# Patient Record
Sex: Female | Born: 1980 | Hispanic: Yes | Marital: Married | Smoking: Never smoker
Health system: Southern US, Community
[De-identification: ages and names within clinical notes are randomized; demographics above are authoritative.]

## PROBLEM LIST (undated history)

## (undated) HISTORY — PX: TUBAL LIGATION: SHX77

---

## 2021-08-26 ENCOUNTER — Emergency Department (HOSPITAL_BASED_OUTPATIENT_CLINIC_OR_DEPARTMENT_OTHER)
Admission: EM | Admit: 2021-08-26 | Discharge: 2021-08-26 | Disposition: A | Discharge status: Home / Self Care | Payer: Worker's Compensation | Attending: Emergency Medicine | Admitting: Emergency Medicine

## 2021-08-26 ENCOUNTER — Other Ambulatory Visit: Payer: Self-pay

## 2021-08-26 ENCOUNTER — Encounter (HOSPITAL_BASED_OUTPATIENT_CLINIC_OR_DEPARTMENT_OTHER): Payer: Self-pay | Admitting: Emergency Medicine

## 2021-08-26 DIAGNOSIS — Z23 Encounter for immunization: Secondary | ICD-10-CM | POA: Insufficient documentation

## 2021-08-26 DIAGNOSIS — N9489 Other specified conditions associated with female genital organs and menstrual cycle: Secondary | ICD-10-CM | POA: Diagnosis not present

## 2021-08-26 DIAGNOSIS — Y9229 Other specified public building as the place of occurrence of the external cause: Secondary | ICD-10-CM | POA: Insufficient documentation

## 2021-08-26 DIAGNOSIS — Z7721 Contact with and (suspected) exposure to potentially hazardous body fluids: Secondary | ICD-10-CM | POA: Diagnosis present

## 2021-08-26 DIAGNOSIS — Y990 Civilian activity done for income or pay: Secondary | ICD-10-CM | POA: Diagnosis not present

## 2021-08-26 DIAGNOSIS — Y93E2 Activity, laundry: Secondary | ICD-10-CM | POA: Insufficient documentation

## 2021-08-26 DIAGNOSIS — W460XXA Contact with hypodermic needle, initial encounter: Secondary | ICD-10-CM | POA: Diagnosis not present

## 2021-08-26 DIAGNOSIS — S61232A Puncture wound without foreign body of right middle finger without damage to nail, initial encounter: Secondary | ICD-10-CM | POA: Diagnosis not present

## 2021-08-26 LAB — COMPREHENSIVE METABOLIC PANEL
ALT: 20 U/L (ref 0–44)
AST: 19 U/L (ref 15–41)
Albumin: 4 g/dL (ref 3.5–5.0)
Alkaline Phosphatase: 89 U/L (ref 38–126)
Anion gap: 8 (ref 5–15)
BUN: 9 mg/dL (ref 6–20)
CO2: 24 mmol/L (ref 22–32)
Calcium: 8.8 mg/dL — ABNORMAL LOW (ref 8.9–10.3)
Chloride: 102 mmol/L (ref 98–111)
Creatinine, Ser: 0.7 mg/dL (ref 0.44–1.00)
GFR, Estimated: >60 mL/min (ref >60)
Glucose, Bld: 99 mg/dL (ref 70–99)
Potassium: 3.4 mmol/L — ABNORMAL LOW (ref 3.5–5.1)
Sodium: 134 mmol/L — ABNORMAL LOW (ref 135–145)
Total Bilirubin: 0.4 mg/dL (ref 0.3–1.2)
Total Protein: 7.6 g/dL (ref 6.5–8.1)

## 2021-08-26 LAB — RAPID HIV SCREEN (HIV 1/2 AB+AG)
HIV 1/2 Antibodies: NONREACTIVE
HIV-1 P24 Antigen - HIV24: NONREACTIVE

## 2021-08-26 LAB — HEPATITIS C ANTIBODY: HCV Ab: NONREACTIVE

## 2021-08-26 LAB — HCG, SERUM, QUALITATIVE: Preg, Serum: NEGATIVE

## 2021-08-26 LAB — HEPATITIS B SURFACE ANTIGEN: Hepatitis B Surface Ag: NONREACTIVE

## 2021-08-26 MED ORDER — TETANUS-DIPHTH-ACELL PERTUSSIS 5-2.5-18.5 LF-MCG/0.5 IM SUSY
0.5000 mL | PREFILLED_SYRINGE | Freq: Once | INTRAMUSCULAR | Status: AC
  Administered 2021-08-26: 0.5 mL via INTRAMUSCULAR
  Filled 2021-08-26: qty 0.5

## 2021-08-26 MED ORDER — ELVITEG-COBIC-EMTRICIT-TENOFAF 150-150-200-10 MG PO TABS: 1.0000 | ORAL_TABLET | Freq: Every day | ORAL | 0 refills | Status: DC

## 2021-08-26 MED ORDER — ELVITEG-COBIC-EMTRICIT-TENOFAF 150-150-200-10 MG PREPACK
1.0000 | ORAL_TABLET | Freq: Once | ORAL | Status: AC
  Administered 2021-08-26: 1 via ORAL
  Filled 2021-08-26: qty 1

## 2021-08-26 NOTE — Discharge Instructions (Signed)
We have started you on a medication to prevent any HIV exposure.  Follow-up with a primary care provider within the next few weeks, you will need repeat testing at that time  Return for new or worsening symptoms.

## 2021-08-26 NOTE — ED Notes (Signed)
ED Provider at bedside. 

## 2021-08-26 NOTE — ED Triage Notes (Signed)
Pt arrives pov, ambulatory, primary language spanish. C/o needle stick to right hand middle finger. Pt endorses cleaning with alcohol and soap and water after incident.

## 2021-08-26 NOTE — ED Provider Notes (Signed)
[facility] EMERGENCY DEPARTMENT Provider Note   CSN: [id number] Arrival date & time: 08/26/21  1448    History  Chief Complaint  Patient presents with   Body Fluid Exposure    Melissa Mclaughlin is a 41 y.o. female here for evaluation puncture wound to 30s right upper extremity.  Immediately started bleeding.  Works at an [employer].  Went to reach into a pole however when it was stopped by needle and laundry.  Unknown whom he will belong to.  No paresthesias, weakness.  Unknown last tetanus, reviewed epic, and none documented.  No fever, weakness, pulsatile bleeding. Cleaned area with soap, water and alcohol swab PTA  Declines Spanish interpretor.  HPI     Home Medications Prior to Admission medications   Medication Sig Start Date End Date Taking? Authorizing Provider  elvitegravir-cobicistat-emtricitabine-tenofovir (GENVOYA) 150-150-200-10 MG TABS tablet Take 1 tablet by mouth daily with breakfast. 08/26/21  Yes Zela Sobieski A, PA-C      Allergies    Patient has no allergy information on record.    Review of Systems   Review of Systems  Constitutional: Negative.   HENT: Negative.    Respiratory: Negative.    Cardiovascular: Negative.   Genitourinary: Negative.   Musculoskeletal: Negative.   Skin:  Positive for wound.  Neurological: Negative.   All other systems reviewed and are negative.  Physical Exam Updated Vital Signs BP 122/84    Pulse (!) 102    Temp 98.5 F (36.9 C) (Oral)    Resp 18    Ht 5\' 5"  (1.651 m)    Wt 104.3 kg    LMP 07/13/2021    SpO2 100%    BMI 38.27 kg/m  Physical Exam Vitals and nursing note reviewed.  Constitutional:      General: She is not in acute distress.    Appearance: She is well-developed. She is not ill-appearing, toxic-appearing or diaphoretic.  HENT:     Head: Normocephalic and atraumatic.     Nose: Nose normal.     Mouth/Throat:     Mouth: Mucous membranes are moist.  Eyes:     Pupils: Pupils are equal, round,  and reactive to light.  Cardiovascular:     Rate and Rhythm: Normal rate.     Pulses: Normal pulses.     Heart sounds: Normal heart sounds.  Pulmonary:     Effort: Pulmonary effort is normal. No respiratory distress.     Breath sounds: Normal breath sounds.  Abdominal:     General: There is no distension.  Musculoskeletal:        General: Normal range of motion.     Cervical back: Normal range of motion.     Comments: No bony tenderness, Full ROM  Skin:    General: Skin is warm and dry.     Capillary Refill: Capillary refill takes less than 2 seconds.     Comments: Puncture wound to palmar aspect third digit right upper extremity distal to DIP. No active bleeding, Stigmata of dried blood. No lacerations to suture.  Neurological:     General: No focal deficit present.     Mental Status: She is alert and oriented to person, place, and time.  Psychiatric:        Mood and Affect: Mood normal.   ED Results / Procedures / Treatments   Labs (all labs ordered are listed, but only abnormal results are displayed) Labs Reviewed  COMPREHENSIVE METABOLIC PANEL - Abnormal; Notable for the following components:  Result Value   Sodium 134 (*)    Potassium 3.4 (*)    Calcium 8.8 (*)    All other components within normal limits  RAPID HIV SCREEN (HIV 1/2 AB+AG)  HCG, SERUM, QUALITATIVE  HEPATITIS C ANTIBODY  HEPATITIS B SURFACE ANTIGEN  RPR    EKG None  Radiology No results found.  Procedures Procedures    Medications Ordered in ED Medications  Tdap (BOOSTRIX) injection 0.5 mL (has no administration in time range)  elvitegravir-cobicistat-emtricitabine-tenofovir (GENVOYA) 150-150-200-10 Prepack 1 each (has no administration in time range)    ED Course/ Medical Decision Making/ A&P    41 year old otherwise well without any chronic medical problems here for evaluation after needlestick at place of employment.  Wound was cleaned prior to arrival.  She is neurovascularly  intact.  No lacerations to suture.  We did update her tetanus here.  We will plan on labs and reassess  Labs personally reviewed and interpreted HIV negative Metabolic panel without significant abnormality  Discussed postexposure prophylaxis.  Discussed risk versus benefit of p.o. medication use to prevent HIV given needlestick.  We discussed transmission statistics of HIV as well as follow-up.  She voiced understanding of risk versus benefit and prefers to start medication.  I discussed she needs close follow-up outpatient with primary care doctor for continued management.  She is agreeable.  The patient has been appropriately medically screened and/or stabilized in the ED. I have low suspicion for any other emergent medical condition which would require further screening, evaluation or treatment in the ED or require inpatient management.  Patient is hemodynamically stable and in no acute distress.  Patient able to ambulate in department prior to ED.  Evaluation does not show acute pathology that would require ongoing or additional emergent interventions while in the emergency department or further inpatient treatment.  I have discussed the diagnosis with the patient and answered all questions.  Pain is been managed while in the emergency department and patient has no further complaints prior to discharge.  Patient is comfortable with plan discussed in room and is stable for discharge at this time.  I have discussed strict return precautions for returning to the emergency department.  Patient was encouraged to follow-up with PCP/specialist refer to at discharge.                           Medical Decision Making Amount and/or Complexity of Data Reviewed Independent Historian:     Details: family in room Labs: ordered. Decision-making details documented in ED Course.  Risk OTC drugs. Prescription drug management. Diagnosis or treatment significantly limited by social determinants of  health. Risk Details: Risk: Language barrier           Final Clinical Impression(s) / ED Diagnoses Final diagnoses:  Accident caused by hypodermic needle, initial encounter    Rx / DC Orders ED Discharge Orders          Ordered    elvitegravir-cobicistat-emtricitabine-tenofovir (GENVOYA) 150-150-200-10 MG TABS tablet  Daily with breakfast        08/26/21 1700              Ivonne Freeburg A, PA-C 08/26/21 1707    Pricilla Loveless, MD 08/26/21 1927

## 2021-08-27 LAB — RPR: RPR Ser Ql: NONREACTIVE

## 2021-10-01 ENCOUNTER — Encounter (HOSPITAL_BASED_OUTPATIENT_CLINIC_OR_DEPARTMENT_OTHER): Payer: Self-pay | Admitting: Emergency Medicine

## 2021-10-01 ENCOUNTER — Emergency Department (HOSPITAL_BASED_OUTPATIENT_CLINIC_OR_DEPARTMENT_OTHER)
Admission: EM | Admit: 2021-10-01 | Discharge: 2021-10-02 | Disposition: A | Discharge status: Home / Self Care | Payer: Worker's Compensation | Attending: Emergency Medicine | Admitting: Emergency Medicine

## 2021-10-01 ENCOUNTER — Emergency Department (HOSPITAL_BASED_OUTPATIENT_CLINIC_OR_DEPARTMENT_OTHER): Payer: Worker's Compensation

## 2021-10-01 ENCOUNTER — Other Ambulatory Visit: Payer: Self-pay

## 2021-10-01 DIAGNOSIS — E878 Other disorders of electrolyte and fluid balance, not elsewhere classified: Secondary | ICD-10-CM | POA: Diagnosis not present

## 2021-10-01 DIAGNOSIS — D72829 Elevated white blood cell count, unspecified: Secondary | ICD-10-CM | POA: Diagnosis not present

## 2021-10-01 DIAGNOSIS — Y990 Civilian activity done for income or pay: Secondary | ICD-10-CM | POA: Diagnosis not present

## 2021-10-01 DIAGNOSIS — R079 Chest pain, unspecified: Secondary | ICD-10-CM | POA: Diagnosis not present

## 2021-10-01 DIAGNOSIS — W461XXA Contact with contaminated hypodermic needle, initial encounter: Secondary | ICD-10-CM | POA: Diagnosis not present

## 2021-10-01 DIAGNOSIS — R739 Hyperglycemia, unspecified: Secondary | ICD-10-CM | POA: Insufficient documentation

## 2021-10-01 DIAGNOSIS — R748 Abnormal levels of other serum enzymes: Secondary | ICD-10-CM | POA: Insufficient documentation

## 2021-10-01 LAB — CBC WITH DIFFERENTIAL/PLATELET
Abs Immature Granulocytes: 0.05 10*3/uL (ref 0.00–0.07)
Basophils Absolute: 0.1 10*3/uL (ref 0.0–0.1)
Basophils Relative: 1 %
Eosinophils Absolute: 0.1 10*3/uL (ref 0.0–0.5)
Eosinophils Relative: 1 %
HCT: 39.6 % (ref 36.0–46.0)
Hemoglobin: 13.2 g/dL (ref 12.0–15.0)
Immature Granulocytes: 0 %
Lymphocytes Relative: 24 %
Lymphs Abs: 3.4 10*3/uL (ref 0.7–4.0)
MCH: 27.6 pg (ref 26.0–34.0)
MCHC: 33.3 g/dL (ref 30.0–36.0)
MCV: 82.7 fL (ref 80.0–100.0)
Monocytes Absolute: 1 10*3/uL (ref 0.1–1.0)
Monocytes Relative: 7 %
Neutro Abs: 9.7 10*3/uL — ABNORMAL HIGH (ref 1.7–7.7)
Neutrophils Relative %: 67 %
Platelets: 237 10*3/uL (ref 150–400)
RBC: 4.79 MIL/uL (ref 3.87–5.11)
RDW: 15.2 % (ref 11.5–15.5)
WBC: 14.3 10*3/uL — ABNORMAL HIGH (ref 4.0–10.5)
nRBC: 0 % (ref 0.0–0.2)

## 2021-10-01 LAB — COMPREHENSIVE METABOLIC PANEL
ALT: 16 U/L (ref 0–44)
AST: 20 U/L (ref 15–41)
Albumin: 4 g/dL (ref 3.5–5.0)
Alkaline Phosphatase: 89 U/L (ref 38–126)
Anion gap: 11 (ref 5–15)
BUN: 9 mg/dL (ref 6–20)
CO2: 21 mmol/L — ABNORMAL LOW (ref 22–32)
Calcium: 8.8 mg/dL — ABNORMAL LOW (ref 8.9–10.3)
Chloride: 104 mmol/L (ref 98–111)
Creatinine, Ser: 0.59 mg/dL (ref 0.44–1.00)
GFR, Estimated: >60 mL/min (ref >60)
Glucose, Bld: 109 mg/dL — ABNORMAL HIGH (ref 70–99)
Potassium: 3.5 mmol/L (ref 3.5–5.1)
Sodium: 136 mmol/L (ref 135–145)
Total Bilirubin: 0.4 mg/dL (ref 0.3–1.2)
Total Protein: 7.6 g/dL (ref 6.5–8.1)

## 2021-10-01 LAB — URINALYSIS, ROUTINE W REFLEX MICROSCOPIC
Bilirubin Urine: NEGATIVE
Glucose, UA: NEGATIVE mg/dL
Ketones, ur: NEGATIVE mg/dL
Leukocytes,Ua: NEGATIVE
Nitrite: NEGATIVE
Protein, ur: NEGATIVE mg/dL
Specific Gravity, Urine: 1.02 (ref 1.005–1.030)
pH: 7 (ref 5.0–8.0)

## 2021-10-01 LAB — PREGNANCY, URINE: Preg Test, Ur: NEGATIVE

## 2021-10-01 LAB — URINALYSIS, MICROSCOPIC (REFLEX)

## 2021-10-01 LAB — RAPID HIV SCREEN (HIV 1/2 AB+AG)
HIV 1/2 Antibodies: NONREACTIVE
HIV-1 P24 Antigen - HIV24: NONREACTIVE

## 2021-10-01 LAB — TROPONIN I (HIGH SENSITIVITY)
Troponin I (High Sensitivity): <2 ng/L (ref <18)
Troponin I (High Sensitivity): <2 ng/L (ref <18)

## 2021-10-01 NOTE — ED Notes (Signed)
Chest xray completed.

## 2021-10-01 NOTE — ED Notes (Signed)
Patient updated on plan of care.  No complaints voiced at this time.  Husband to bedside.  ?

## 2021-10-01 NOTE — ED Triage Notes (Signed)
Pt here for one month follow up from needlestick to right middle finger. Patient does not have primary care provider. ?

## 2021-10-01 NOTE — ED Notes (Signed)
Patient instructed to provide urine sample.

## 2021-10-02 LAB — HEPATITIS PANEL, ACUTE
HCV Ab: NONREACTIVE
Hep A IgM: NONREACTIVE
Hep B C IgM: NONREACTIVE
Hepatitis B Surface Ag: NONREACTIVE

## 2021-10-02 LAB — HIV ANTIBODY (ROUTINE TESTING W REFLEX): HIV Screen 4th Generation wRfx: NONREACTIVE

## 2021-10-02 NOTE — Discharge Instructions (Addendum)
You were seen here today for evaluation after a needle stick and your chest pain. Your HIV results and Hepatitis results will be populated through your MyChart. Please make sure to check back for any positive results. Follow up with your PCP for further evaluation and surveillance. I have sent a referral to infectious disease for you to follow up with further. Please call to schedule and appointment. If you do not have a PCP, please call the number attached and schedule and appointment.  ? ? ?Lo vieron aqu? hoy para una evaluaci?n despu?s de un pinchazo con Melissa Mclaughlin y su dolor en el pecho. Sus resultados de VIH y hepatitis se completar?n a trav?s de Designer, industrial/product. Por favor, aseg?rese de volver a comprobar si hay resultados positivos. Haga un seguimiento con su PCP para una evaluaci?n y vigilancia adicionales. Le envi? una remisi?n a enfermedades infecciosas para que haga un seguimiento adicional. Por favor llame para programar y cita. Si no tiene un PCP, llame al n?mero adjunto y programe una cita. ? ?Comun?quese con un m?dico si: ?El dolor de pecho no desaparece. ?Se siente deprimido. ?Tiene fiebre. ?Solicite ayuda inmediatamente si: ?El dolor en el pecho es m?s intenso. ?Tiene tos que empeora o tose con [place]. ?Tiene dolor muy intenso en el vientre (abdomen). ?Pierde el conocimiento (se desmaya). ?Tiene cualquiera de estos s?ntomas sin ninguna causa clara: ?Malestar repentino en el pecho. ?Molestias repentinas [employer], la espalda, el cuello o la mand?bula. ?Le falta el aire en cualquier momento. ?Comienza a sudar de [place] repentina o la piel se le humedece. ?Siente malestar estomacal (n?useas). ?Vomita. ?Se siente repentinamente mareado o se desmaya. ?Se siente muy d?bil o cansado. ?El coraz?n comienza a latirle r?pidamente o parece que se saltea latidos. ?

## 2021-10-02 NOTE — ED Provider Notes (Signed)
?[facility] EMERGENCY DEPARTMENT ?Provider Note ? ? ?CSN: [id number] ?Arrival date & time: 10/01/21  1449 ? ?  ? ?History ?Chief Complaint  ?Patient presents with  ? Follow-up  ?  Right finger  ? ? ?Melissa Mclaughlin is a 41 y.o. female otherwise healthy presents the emergency department for re-evaluation after a needlestick 1 month ago.  The patient was seen on 08-26-2021 after accidental stick from a needle while at work.  There was unknown patient source of the needle as it was mixed up in laundry.  She was started on Genvoya at that time.  She was also given a tetanus booster.  It does not look like she they were tested for hepatitis.  She reports she had some chest pain starting around 930 this morning that lasted until 1 hour ago.  She denies any shortness of breath.  She denies any abdominal pain, nausea, vomiting, or diarrhea.  She denies any lightly colored stools.  Denies any blood in her stools or any melena.  She denies any fevers or recent illnesses.  She reports that she was compliant with her Genvoya and finished it a few days ago. ? ? ? ?HPI ? ?  ? ?Home Medications ?Prior to Admission medications   ?Medication Sig Start Date End Date Taking? Authorizing Provider  ?elvitegravir-cobicistat-emtricitabine-tenofovir (GENVOYA) 150-150-200-10 MG TABS tablet Take 1 tablet by mouth daily with breakfast. 08/26/21   Henderly, Britni A, PA-C  ?   ? ?Allergies    ?Patient has no known allergies.   ? ?Review of Systems   ?Review of Systems  ?Constitutional:  Negative for chills and fever.  ?Respiratory:  Negative for cough and shortness of breath.   ?Cardiovascular:  Positive for chest pain.  ?Gastrointestinal:  Negative for abdominal pain, diarrhea, nausea and vomiting.  ? ?Physical Exam ?Updated Vital Signs ?BP 103/70   Pulse 67   Temp 98.7 ?F (37.1 ?C) (Oral)   Resp 18   Ht 5\' 6"  (1.676 m)   Wt 103.9 kg   LMP 09/13/2021   SpO2 99%   BMI 36.96 kg/m?  ?Physical Exam ?Vitals and nursing note reviewed.   ?Constitutional:   ?   General: She is not in acute distress. ?   Appearance: Normal appearance. She is not ill-appearing or toxic-appearing.  ?HENT:  ?   Head: Normocephalic and atraumatic.  ?Eyes:  ?   General: No scleral icterus. ?Cardiovascular:  ?   Rate and Rhythm: Normal rate and regular rhythm.  ?Pulmonary:  ?   Effort: Pulmonary effort is normal. No respiratory distress.  ?   Breath sounds: Normal breath sounds.  ?Abdominal:  ?   General: Bowel sounds are normal.  ?   Palpations: Abdomen is soft.  ?   Tenderness: There is no abdominal tenderness. There is no guarding or rebound.  ?Musculoskeletal:     ?   General: No deformity.  ?   Cervical back: Normal range of motion.  ?Skin: ?   General: Skin is warm and dry.  ?Neurological:  ?   General: No focal deficit present.  ?   Mental Status: She is alert. Mental status is at baseline.  ? ? ?ED Results / Procedures / Treatments   ?Labs ?(all labs ordered are listed, but only abnormal results are displayed) ?Labs Reviewed  ?COMPREHENSIVE METABOLIC PANEL - Abnormal; Notable for the following components:  ?    Result Value  ? CO2 21 (*)   ? Glucose, Bld 109 (*)   ?  Calcium 8.8 (*)   ? All other components within normal limits  ?CBC WITH DIFFERENTIAL/PLATELET - Abnormal; Notable for the following components:  ? WBC 14.3 (*)   ? Neutro Abs 9.7 (*)   ? All other components within normal limits  ?URINALYSIS, ROUTINE W REFLEX MICROSCOPIC - Abnormal; Notable for the following components:  ? Hgb urine dipstick TRACE (*)   ? All other components within normal limits  ?URINALYSIS, MICROSCOPIC (REFLEX) - Abnormal; Notable for the following components:  ? Bacteria, UA FEW (*)   ? All other components within normal limits  ?RAPID HIV SCREEN (HIV 1/2 AB+AG)  ?PREGNANCY, URINE  ?HEPATITIS PANEL, ACUTE  ?HIV ANTIBODY (ROUTINE TESTING W REFLEX)  ?TROPONIN I (HIGH SENSITIVITY)  ?TROPONIN I (HIGH SENSITIVITY)  ? ? ?EKG ?EKG Interpretation ? ?Date/Time:  Sunday October 01 2021  15:27:02 EST ?Ventricular Rate:  95 ?PR Interval:  184 ?QRS Duration: 84 ?QT Interval:  346 ?QTC Calculation: 434 ?R Axis:   79 ?Text Interpretation: Normal sinus rhythm Normal ECG No previous ECGs available Confirmed by Vanetta Mulders [id number]) on 10/01/2021 8:10:23 PM ? ?Radiology ?DG Chest 2 View ? ?Result Date: 10/01/2021 ?CLINICAL DATA:  Chest pain and shortness of breath EXAM: CHEST - 2 VIEW COMPARISON:  None. FINDINGS: The heart size and mediastinal contours are within normal limits. Both lungs are clear. The visualized skeletal structures are unremarkable. IMPRESSION: No active cardiopulmonary disease. Electronically Signed   By: Alcide Clever M.D.   On: 10/01/2021 20:50   ? ?Procedures ?Procedures  ? ? ?Medications Ordered in ED ?Medications - No data to display ? ?ED Course/ Medical Decision Making/ A&P ?  ?                        ?Medical Decision Making ?Amount and/or Complexity of Data Reviewed ?Labs: ordered. ?Radiology: ordered. ? ? ?41 year old female presents emergency department for evaluation after needlestick 1 month ago and for chest pain today.  Differential diagnosis includes was not limited to ACS, PE, pneumonia, costochondritis, MSK, HIV, hepatitis.  Vital signs are unremarkable.  Patient normotensive, afebrile, normal pulse rate, satting well on room air without any increased work of breathing.  Physical exam is unremarkable.  HIV and hepatitis panel ordered as well as chest pain work-up. ? ?I independently reviewed and interpreted the patient's labs and imaging and agree with radiologist interpretation.  CBC shows elevated white blood cell count with a left shift which possibly due to stress.  CMP shows mildly decreased bicarb at 21.  Elevated glucose at 109 although not fasting.  Mildly decreased calcium at 8.8.  LFTs are normal.  No other electrolyte abnormalities.  Urinalysis shows trace amount of blood but no nitrites or leukocytes.  There was few bacteria although 0-5 white blood cells  seen.  Pregnancy test was negative.  Rapid HIV was negative but HIV panel was also ordered.  Troponin was less than 2 on initial and repeat.  Chest x-ray shows no acute active cardiopulmonary process. ? ?Hear score of 1.  EKG shows normal sinus rhythm with negative troponins.  Doubt ACS at this time.  Patient is PERC negative.  Doubt PE.  No pneumonia seen on chest x-ray.  Lung sounds are clear. ? ?The patient does not need to be on any more post-phylaxis care as the CDC only recommends 28 days treatment for HIV. ? ?The patient has not had any symptoms consistent with Hepatitis. Labs will not result until the morning or in  a few days. Attending recommended discharge home and follow up with [facility]. Recommended re-check for her HIV in 6 months. In the meantime, safe sex practices were discussed. Return precautions given for her chest pain. Patient agrees to plan. The patient is stable and being discharged home in good condition.  ? ?Final Clinical Impression(s) / ED Diagnoses ?Final diagnoses:  ?Chest pain, unspecified type  ?Exposure to body fluids by contaminated hypodermic needle stick  ? ? ?Rx / DC Orders ?ED Discharge Orders   ? ? None  ? ?  ? ? ?  ?Achille Rich, PA-C ?10/02/21 6886 ? ?  ?Zadie Rhine, MD ?10/02/21 [phone] ? ?

## 2021-10-02 NOTE — ED Notes (Signed)
Discharge teaching and instructions reviewed with patient with PA and interpreter.  All questions answered.  No c/o at this time.  Patient made aware to follow up ?

## 2022-07-17 ENCOUNTER — Emergency Department (HOSPITAL_COMMUNITY)
Admission: EM | Admit: 2022-07-17 | Discharge: 2022-07-18 | Disposition: A | Discharge status: Home / Self Care | Payer: No Typology Code available for payment source | Attending: Emergency Medicine | Admitting: Emergency Medicine

## 2022-07-17 ENCOUNTER — Encounter (HOSPITAL_COMMUNITY): Payer: Self-pay | Admitting: Emergency Medicine

## 2022-07-17 ENCOUNTER — Emergency Department (HOSPITAL_COMMUNITY): Payer: No Typology Code available for payment source

## 2022-07-17 ENCOUNTER — Other Ambulatory Visit: Payer: Self-pay

## 2022-07-17 DIAGNOSIS — R109 Unspecified abdominal pain: Secondary | ICD-10-CM | POA: Insufficient documentation

## 2022-07-17 DIAGNOSIS — Y92410 Unspecified street and highway as the place of occurrence of the external cause: Secondary | ICD-10-CM | POA: Diagnosis not present

## 2022-07-17 DIAGNOSIS — M545 Low back pain, unspecified: Secondary | ICD-10-CM | POA: Diagnosis not present

## 2022-07-17 DIAGNOSIS — R079 Chest pain, unspecified: Secondary | ICD-10-CM | POA: Insufficient documentation

## 2022-07-17 DIAGNOSIS — M542 Cervicalgia: Secondary | ICD-10-CM | POA: Diagnosis present

## 2022-07-17 LAB — I-STAT CHEM 8, ED
BUN: 8 mg/dL (ref 6–20)
Calcium, Ion: 1.13 mmol/L — ABNORMAL LOW (ref 1.15–1.40)
Chloride: 105 mmol/L (ref 98–111)
Creatinine, Ser: 0.5 mg/dL (ref 0.44–1.00)
Glucose, Bld: 94 mg/dL (ref 70–99)
HCT: 43 % (ref 36.0–46.0)
Hemoglobin: 14.6 g/dL (ref 12.0–15.0)
Potassium: 3.6 mmol/L (ref 3.5–5.1)
Sodium: 140 mmol/L (ref 135–145)
TCO2: 23 mmol/L (ref 22–32)

## 2022-07-17 LAB — CBC
HCT: 41.6 % (ref 36.0–46.0)
Hemoglobin: 13.3 g/dL (ref 12.0–15.0)
MCH: 26.4 pg (ref 26.0–34.0)
MCHC: 32 g/dL (ref 30.0–36.0)
MCV: 82.7 fL (ref 80.0–100.0)
Platelets: 276 10*3/uL (ref 150–400)
RBC: 5.03 MIL/uL (ref 3.87–5.11)
RDW: 13.8 % (ref 11.5–15.5)
WBC: 15.6 10*3/uL — ABNORMAL HIGH (ref 4.0–10.5)
nRBC: 0 % (ref 0.0–0.2)

## 2022-07-17 LAB — COMPREHENSIVE METABOLIC PANEL
ALT: 22 U/L (ref 0–44)
AST: 16 U/L (ref 15–41)
Albumin: 4.4 g/dL (ref 3.5–5.0)
Alkaline Phosphatase: 97 U/L (ref 38–126)
Anion gap: 8 (ref 5–15)
BUN: 10 mg/dL (ref 6–20)
CO2: 22 mmol/L (ref 22–32)
Calcium: 9.3 mg/dL (ref 8.9–10.3)
Chloride: 108 mmol/L (ref 98–111)
Creatinine, Ser: 0.6 mg/dL (ref 0.44–1.00)
GFR, Estimated: >60 mL/min (ref >60)
Glucose, Bld: 95 mg/dL (ref 70–99)
Potassium: 3.4 mmol/L — ABNORMAL LOW (ref 3.5–5.1)
Sodium: 138 mmol/L (ref 135–145)
Total Bilirubin: 0.5 mg/dL (ref 0.3–1.2)
Total Protein: 8.3 g/dL — ABNORMAL HIGH (ref 6.5–8.1)

## 2022-07-17 LAB — POC URINE PREG, ED: Preg Test, Ur: NEGATIVE

## 2022-07-17 MED ORDER — IOHEXOL 300 MG/ML  SOLN
100.0000 mL | Freq: Once | INTRAMUSCULAR | Status: AC | PRN
  Administered 2022-07-17: 100 mL via INTRAVENOUS

## 2022-07-17 MED ORDER — ONDANSETRON 4 MG PO TBDP
4.0000 mg | ORAL_TABLET | Freq: Once | ORAL | Status: AC
  Administered 2022-07-17: 4 mg via ORAL
  Filled 2022-07-17: qty 1

## 2022-07-17 MED ORDER — ACETAMINOPHEN 325 MG PO TABS
650.0000 mg | ORAL_TABLET | Freq: Once | ORAL | Status: AC
  Administered 2022-07-18: 650 mg via ORAL
  Filled 2022-07-17: qty 2

## 2022-07-17 NOTE — ED Notes (Signed)
PT request IV to be remove. Writer remove IV from [facility]
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
## 2022-07-18 MED ORDER — CYCLOBENZAPRINE HCL 10 MG PO TABS: 10.0000 mg | ORAL_TABLET | Freq: Three times a day (TID) | ORAL | 0 refills | Status: DC | PRN

## 2022-07-18 MED ORDER — IBUPROFEN 800 MG PO TABS
800.0000 mg | ORAL_TABLET | Freq: Once | ORAL | Status: AC
  Administered 2022-07-18: 800 mg via ORAL
  Filled 2022-07-18: qty 1
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
## 2023-01-18 IMAGING — DX DG CHEST 2V
2 series · 2 of 2 positions shown · non-contrast
Comparison: None.

CLINICAL DATA: Chest pain and shortness of breath

EXAM:
CHEST - 2 VIEW

[chest pa]
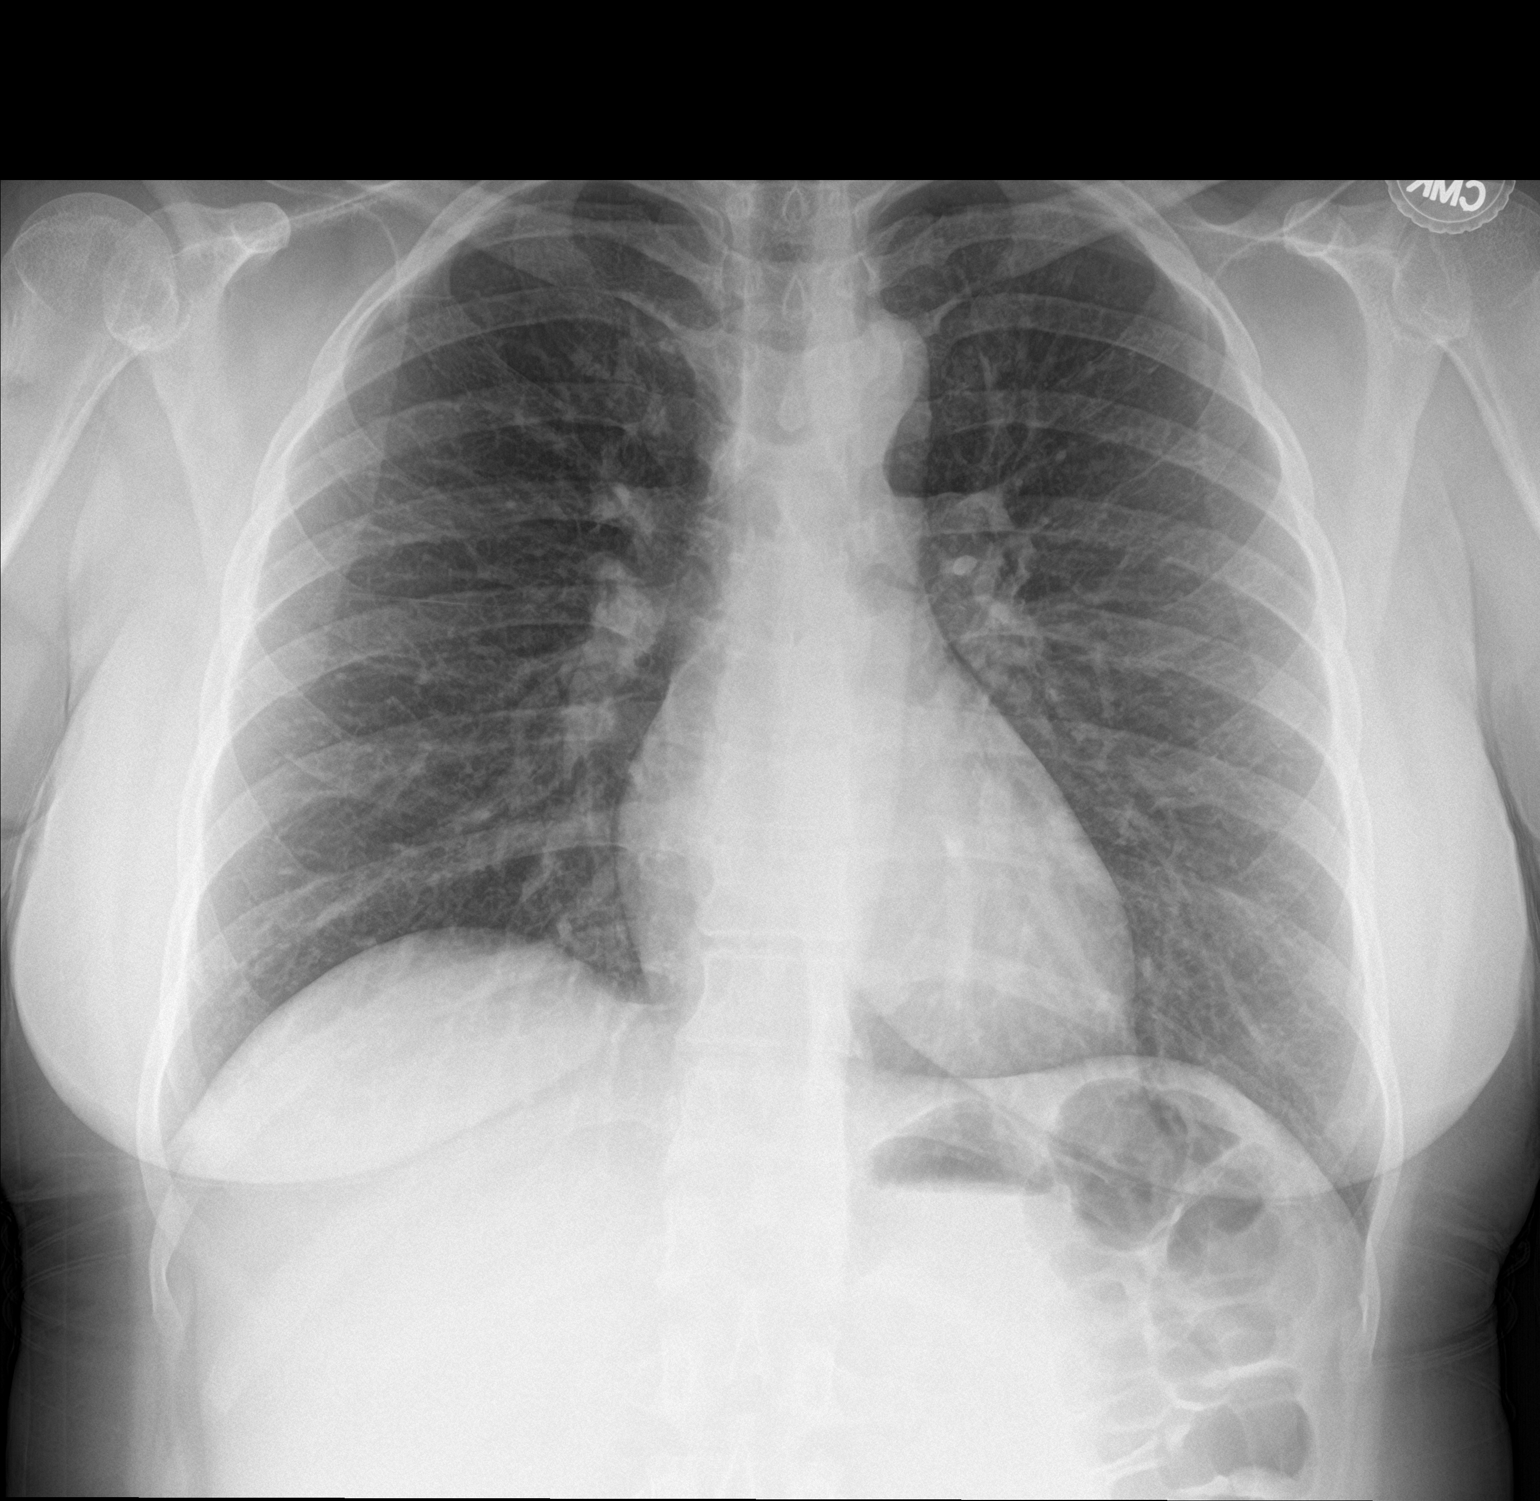

[chest lat]
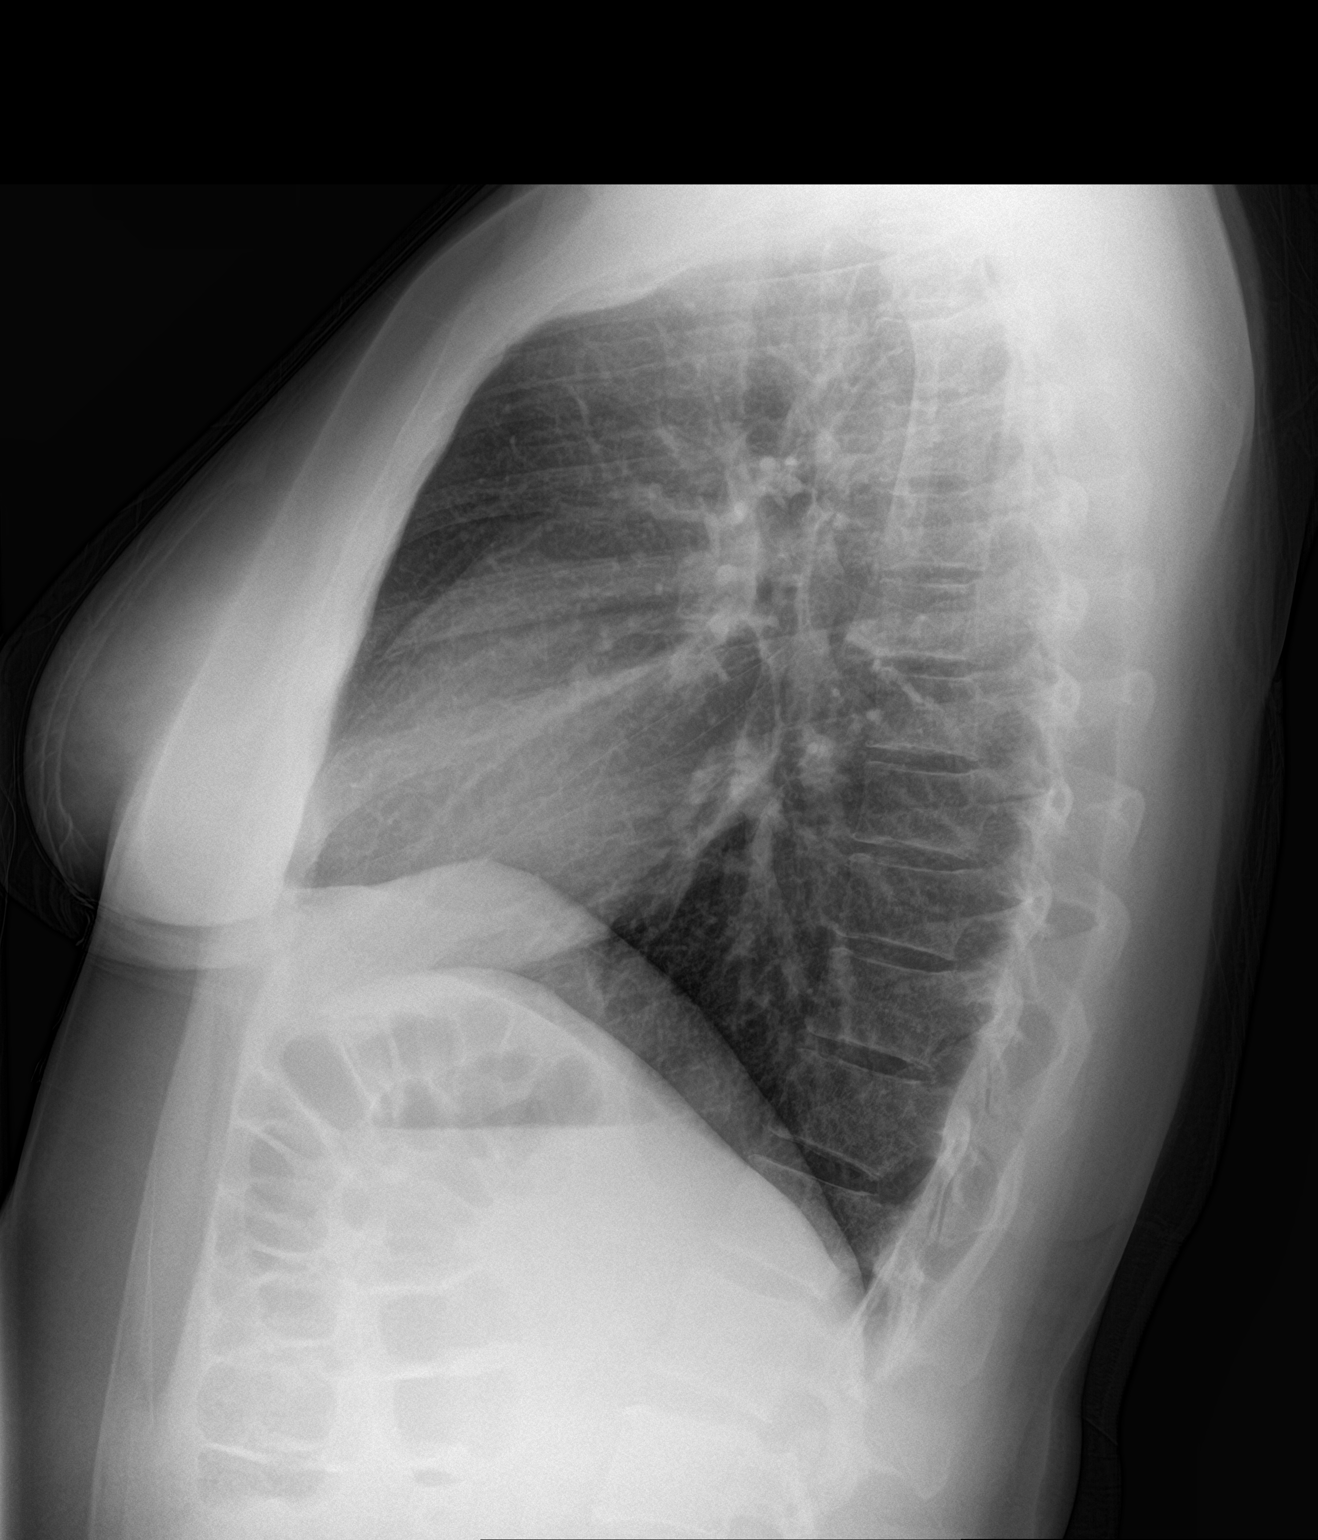

[2 of 2 positions shown; findings below may reference images not displayed]

FINDINGS: The heart size and mediastinal contours are within normal limits.
Both lungs are clear. The visualized skeletal structures are
unremarkable.
IMPRESSION: No active cardiopulmonary disease.

## 2023-09-24 ENCOUNTER — Other Ambulatory Visit (HOSPITAL_BASED_OUTPATIENT_CLINIC_OR_DEPARTMENT_OTHER): Payer: Self-pay | Admitting: Nurse Practitioner

## 2023-09-24 DIAGNOSIS — Z1231 Encounter for screening mammogram for malignant neoplasm of breast: Secondary | ICD-10-CM

## 2024-02-07 ENCOUNTER — Other Ambulatory Visit: Payer: Self-pay | Admitting: Obstetrics and Gynecology

## 2024-02-07 DIAGNOSIS — Z1231 Encounter for screening mammogram for malignant neoplasm of breast: Secondary | ICD-10-CM

## 2024-04-16 ENCOUNTER — Ambulatory Visit: Payer: Self-pay | Admitting: *Deleted

## 2024-04-16 ENCOUNTER — Ambulatory Visit: Payer: Self-pay

## 2024-04-16 VITALS — Wt 248.0 lb

## 2024-04-16 DIAGNOSIS — N644 Mastodynia: Secondary | ICD-10-CM

## 2024-04-16 DIAGNOSIS — Z1239 Encounter for other screening for malignant neoplasm of breast: Secondary | ICD-10-CM

## 2024-04-16 DIAGNOSIS — N6321 Unspecified lump in the left breast, upper outer quadrant: Secondary | ICD-10-CM

## 2024-04-16 NOTE — Progress Notes (Signed)
 Ms. Melissa Mclaughlin is a 43 y.o. female who presents to [facility] clinic today with complaint of left breast lump and pain x 1 year. Patient states the pain comes and goes. Patient rates the pain at a 8 out of 10.    Pap Smear: Pap smear not completed today. Last Pap smear was 04/08/2024 at [facility] clinic and was normal with negative HPV.  Per patient has no history of an abnormal Pap smear. Last Pap smear result is available in Epic.   Physical exam: Breasts Breasts symmetrical. No skin abnormalities bilateral breasts. No nipple retraction bilateral breasts. No nipple discharge bilateral breasts. No lymphadenopathy. No lumps palpated right breast. Palpated a lump within the left breast between 1 o'clock and 2 o'clock. Complaints of tenderness when palpated left breast lump.    Pelvic/Bimanual Pap is not indicated today per BCCCP guidelines.   Smoking History: Patient has never smoked.   Patient Navigation: Patient education provided. Access to services provided for patient through [facility] program. Spanish interpreter [name] from [facility] provided.    Breast and Cervical Cancer Risk Assessment: Patient has family history of her maternal aunt having breast cancer. Patient has no known genetic mutations or history of radiation treatment to the chest before age 33. Patient does not have history of cervical dysplasia, immunocompromised, or DES exposure in-utero.  Risk Scores as of Encounter on 04/16/2024     Alisa           5-year 0.44%   Lifetime 7.27%   This patient is [place] but has no documented birth country, so the [place] model used data from [place] patients to calculate their risk score. Document a birth country in the Demographics activity for a more accurate score.         Last calculated by Driscilla Wanda SQUIBB, RN on 04/16/2024 at  4:41 PM       A: BCCCP exam without pap smear Complaint of left breast lump and pain.  P: Referred patient to the [facility] for a diagnostic mammogram. Appointment scheduled Friday, April 24, 2024 at 1350.  Driscilla Wanda SQUIBB, RN 04/16/2024 1:09 PM

## 2024-04-16 NOTE — Patient Instructions (Signed)
 Explained breast self awareness with Melissa Mclaughlin. Patient did not need a Pap smear today due to last Pap smear and HPV typing was 04/08/2024. Let her know BCCCP will cover Pap smears and HPV typing every 5 years unless has a history of abnormal Pap smears. Referred patient to the [facility] for a diagnostic mammogram. Appointment scheduled Friday, April 24, 2024 at 1350. Patient aware of appointment and will be there. Melissa Mclaughlin verbalized understanding.  Brittan Butterbaugh, Wanda Ship, RN 1:09 PM

## 2024-04-24 ENCOUNTER — Ambulatory Visit
Admission: RE | Admit: 2024-04-24 | Discharge: 2024-04-24 | Disposition: A | Discharge status: Home / Self Care | Payer: Self-pay | Source: Ambulatory Visit | Attending: Obstetrics and Gynecology | Admitting: Obstetrics and Gynecology

## 2024-04-24 DIAGNOSIS — N644 Mastodynia: Secondary | ICD-10-CM

## 2024-04-24 DIAGNOSIS — N6321 Unspecified lump in the left breast, upper outer quadrant: Secondary | ICD-10-CM
